# Patient Record
Sex: Male | Born: 1995 | Race: Black or African American | Hispanic: No | Marital: Single | Smoking: Current some day smoker
Health system: Southern US, Community
[De-identification: ages and names within clinical notes are randomized; demographics above are authoritative.]

## PROBLEM LIST (undated history)

## (undated) DIAGNOSIS — G4700 Insomnia, unspecified: Secondary | ICD-10-CM

## (undated) HISTORY — PX: FOOT SURGERY: SHX648

## (undated) HISTORY — DX: Insomnia, unspecified: G47.00

---
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
## 2019-12-07 ENCOUNTER — Other Ambulatory Visit: Payer: Self-pay

## 2019-12-07 DIAGNOSIS — Z20822 Contact with and (suspected) exposure to covid-19: Secondary | ICD-10-CM

## 2019-12-08 LAB — NOVEL CORONAVIRUS, NAA: SARS-CoV-2, NAA: NOT DETECTED

## 2020-09-15 DIAGNOSIS — Z87898 Personal history of other specified conditions: Secondary | ICD-10-CM | POA: Insufficient documentation

## 2020-09-15 DIAGNOSIS — R040 Epistaxis: Secondary | ICD-10-CM | POA: Insufficient documentation

## 2020-09-15 DIAGNOSIS — Z0000 Encounter for general adult medical examination without abnormal findings: Secondary | ICD-10-CM | POA: Insufficient documentation

## 2020-09-16 ENCOUNTER — Other Ambulatory Visit: Payer: Self-pay

## 2020-09-16 ENCOUNTER — Ambulatory Visit: Payer: Self-pay

## 2020-09-16 DIAGNOSIS — Z0289 Encounter for other administrative examinations: Secondary | ICD-10-CM

## 2020-09-16 DIAGNOSIS — Z0000 Encounter for general adult medical examination without abnormal findings: Secondary | ICD-10-CM

## 2020-09-16 LAB — POCT URINALYSIS DIPSTICK
Bilirubin, UA: NEGATIVE
Blood, UA: NEGATIVE
Glucose, UA: NEGATIVE
Ketones, UA: NEGATIVE
Leukocytes, UA: NEGATIVE
Nitrite, UA: NEGATIVE
Protein, UA: NEGATIVE
Spec Grav, UA: 1.015 (ref 1.010–1.025)
Urobilinogen, UA: 0.2 E.U./dL
pH, UA: 6 (ref 5.0–8.0)

## 2020-09-16 NOTE — Progress Notes (Addendum)
Pt presented today to complete partial Pre-employment physical and Complete UDS.  [employer] Specimen ID: [id number]

## 2020-09-19 LAB — CMP12+LP+TP+TSH+6AC+CBC/D/PLT
ALT: 8 IU/L (ref 0–44)
AST: 19 IU/L (ref 0–40)
Albumin/Globulin Ratio: 1.9 (ref 1.2–2.2)
Albumin: 4.9 g/dL (ref 4.1–5.2)
Alkaline Phosphatase: 89 IU/L (ref 44–121)
BUN/Creatinine Ratio: 16 (ref 9–20)
BUN: 18 mg/dL (ref 6–20)
Basophils Absolute: 0 10*3/uL (ref 0.0–0.2)
Basos: 1 %
Bilirubin Total: 1.4 mg/dL — ABNORMAL HIGH (ref 0.0–1.2)
Calcium: 9.7 mg/dL (ref 8.7–10.2)
Chloride: 101 mmol/L (ref 96–106)
Chol/HDL Ratio: 2 ratio (ref 0.0–5.0)
Cholesterol, Total: 175 mg/dL (ref 100–199)
Creatinine, Ser: 1.16 mg/dL (ref 0.76–1.27)
EOS (ABSOLUTE): 0 10*3/uL (ref 0.0–0.4)
Eos: 1 %
Estimated CHD Risk: <0.5 times avg. (ref 0.0–1.0)
Free Thyroxine Index: 1.9 (ref 1.2–4.9)
GFR calc Af Amer: 101 mL/min/{1.73_m2} (ref >59)
GFR calc non Af Amer: 88 mL/min/{1.73_m2} (ref >59)
GGT: 22 IU/L (ref 0–65)
Globulin, Total: 2.6 g/dL (ref 1.5–4.5)
Glucose: 90 mg/dL (ref 65–99)
HDL: 87 mg/dL (ref >39)
Hematocrit: 46.7 % (ref 37.5–51.0)
Hemoglobin: 15.7 g/dL (ref 13.0–17.7)
Immature Grans (Abs): 0 10*3/uL (ref 0.0–0.1)
Immature Granulocytes: 0 %
Iron: 137 ug/dL (ref 38–169)
LDH: 145 IU/L (ref 121–224)
LDL Chol Calc (NIH): 81 mg/dL (ref 0–99)
Lymphocytes Absolute: 2 10*3/uL (ref 0.7–3.1)
Lymphs: 54 %
MCH: 28.3 pg (ref 26.6–33.0)
MCHC: 33.6 g/dL (ref 31.5–35.7)
MCV: 84 fL (ref 79–97)
Monocytes Absolute: 0.2 10*3/uL (ref 0.1–0.9)
Monocytes: 5 %
Neutrophils Absolute: 1.5 10*3/uL (ref 1.4–7.0)
Neutrophils: 39 %
Phosphorus: 3.3 mg/dL (ref 2.8–4.1)
Platelets: 244 10*3/uL (ref 150–450)
Potassium: 4.3 mmol/L (ref 3.5–5.2)
RBC: 5.54 x10E6/uL (ref 4.14–5.80)
RDW: 13.8 % (ref 11.6–15.4)
Sodium: 140 mmol/L (ref 134–144)
T3 Uptake Ratio: 31 % (ref 24–39)
T4, Total: 6 ug/dL (ref 4.5–12.0)
TSH: 0.838 u[IU]/mL (ref 0.450–4.500)
Total Protein: 7.5 g/dL (ref 6.0–8.5)
Triglycerides: 32 mg/dL (ref 0–149)
Uric Acid: 5.5 mg/dL (ref 3.8–8.4)
VLDL Cholesterol Cal: 7 mg/dL (ref 5–40)
WBC: 3.7 10*3/uL (ref 3.4–10.8)

## 2020-09-19 LAB — QUANTIFERON-TB GOLD PLUS
QuantiFERON Mitogen Value: >10 IU/mL
QuantiFERON Nil Value: 0.02 IU/mL
QuantiFERON TB1 Ag Value: 0.03 IU/mL
QuantiFERON TB2 Ag Value: 0.04 IU/mL
QuantiFERON-TB Gold Plus: NEGATIVE

## 2020-09-19 LAB — HEPATITIS B SURFACE ANTIBODY,QUALITATIVE: Hep B Surface Ab, Qual: NONREACTIVE

## 2020-09-22 ENCOUNTER — Ambulatory Visit: Payer: Self-pay | Admitting: Physician Assistant

## 2020-09-22 ENCOUNTER — Encounter: Payer: Self-pay | Admitting: Physician Assistant

## 2020-09-22 ENCOUNTER — Other Ambulatory Visit: Payer: Self-pay

## 2020-09-22 VITALS — BP 132/88 | HR 69 | Temp 97.5°F | Resp 16 | Ht 74.0 in | Wt 178.0 lb

## 2020-09-22 DIAGNOSIS — Z23 Encounter for immunization: Secondary | ICD-10-CM

## 2020-09-22 NOTE — Progress Notes (Signed)
Pt presents today to complete physcial with Ron Katrinka Blazing PA-C, Pt stated he's been having issues sleeping for about a year and a half. CL,RMA

## 2020-09-22 NOTE — Progress Notes (Signed)
° °  Subjective: Preemployment physical    Patient ID: Anthony Moore, male    DOB: 08-19-1995, 25 y.o.   MRN: [id number]  HPI Patient presents for preemployment physical voices no concerns or complaints.   Review of Systems    Negative Objective:   Physical Exam  HEENT is unremarkable.  Neck is supple without adenopathy or bruits.  Lungs clear to auscultation.  Heart is bradycardic regular rhythm.  Abdomen negative HSM, normoactive bowel sounds, soft and nontender to palpation.  No obvious deformity to the upper or lower extremities.  Patient has full and equal range of motion of the upper and lower extremities.  No obvious deformity to the cervical lumbar spine.  Patient has full and equal range of motion of the cervical lumbar spine.  Cranial nerves II through XII grossly intact.     Assessment & Plan: Well exam.  Discussed lab results with patient revealing is nonreactive to hepatitis B.  Patient elected to get the first doses at today's visit.  Patient will follow up as directed for second dose as necessary.

## 2020-10-28 ENCOUNTER — Other Ambulatory Visit: Payer: Self-pay

## 2020-10-28 DIAGNOSIS — Z0184 Encounter for antibody response examination: Secondary | ICD-10-CM

## 2020-10-29 LAB — HEPATITIS B SURFACE ANTIBODY,QUALITATIVE: Hep B Surface Ab, Qual: REACTIVE

## 2020-12-06 ENCOUNTER — Other Ambulatory Visit: Payer: Self-pay

## 2020-12-06 ENCOUNTER — Ambulatory Visit: Payer: Self-pay | Admitting: Physician Assistant

## 2020-12-06 VITALS — BP 120/61 | HR 60 | Temp 98.4°F | Resp 14 | Ht 74.0 in | Wt 182.0 lb

## 2020-12-06 DIAGNOSIS — T783XXA Angioneurotic edema, initial encounter: Secondary | ICD-10-CM

## 2020-12-06 MED ORDER — DENAVIR 1 % EX CREA: 1.0000 "application " | TOPICAL_CREAM | CUTANEOUS | 0 refills | Status: DC

## 2020-12-06 MED ORDER — HYDROXYZINE HCL 50 MG PO TABS: 50.0000 mg | ORAL_TABLET | Freq: Three times a day (TID) | ORAL | 0 refills | Status: DC | PRN

## 2020-12-06 MED ORDER — METHYLPREDNISOLONE 4 MG PO TBPK: ORAL_TABLET | ORAL | 0 refills | Status: DC

## 2020-12-06 NOTE — Progress Notes (Signed)
   Subjective: Lip edema    Patient ID: Anthony Moore, male    DOB: 12-26-1995, 25 y.o.   MRN: [id number]  HPI Patient presents with upper lip edema and erythema. Onset of complaint this morning. Patient state he was brushing his teeth and noticed a blister on the lower lip. Patient state when the tooth brush/paste touch the blister he felt a "burning sensation" followed by upper lip edema. Patient denies dyspnea or dysphagia.   Review of Systems    Negative except for complaint Objective:   Physical Exam No acute distress.  HEENT reveals a "ruptured blister" right lateral lower lip.  Patient up with lip is edematous and erythematous.       Assessment & Plan: Angioedema  Discussed treatment plan consisting of Atarax, Medrol Dosepak, and interview.  Advised to follow-up in 3 days if no improvement or worsening of complaint.

## 2020-12-06 NOTE — Progress Notes (Signed)
Pt presents with lips swollen since 6:30am. Today.  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 CL,RMA

## 2020-12-16 ENCOUNTER — Encounter: Payer: Self-pay | Admitting: Physician Assistant

## 2020-12-16 ENCOUNTER — Ambulatory Visit: Payer: Self-pay | Admitting: Physician Assistant

## 2020-12-16 ENCOUNTER — Other Ambulatory Visit: Payer: Self-pay

## 2020-12-16 ENCOUNTER — Ambulatory Visit
Admission: RE | Admit: 2020-12-16 | Discharge: 2020-12-16 | Disposition: A | Discharge status: Home / Self Care | Payer: Self-pay | Source: Ambulatory Visit | Attending: Physician Assistant | Admitting: Physician Assistant

## 2020-12-16 VITALS — BP 127/78 | HR 51 | Temp 98.0°F | Resp 14 | Ht 74.0 in | Wt 184.0 lb

## 2020-12-16 DIAGNOSIS — M25511 Pain in right shoulder: Secondary | ICD-10-CM

## 2020-12-16 MED ORDER — IBUPROFEN 800 MG PO TABS: 800.0000 mg | ORAL_TABLET | Freq: Three times a day (TID) | ORAL | 0 refills | Status: DC | PRN

## 2020-12-16 MED ORDER — CYCLOBENZAPRINE HCL 10 MG PO TABS: 10.0000 mg | ORAL_TABLET | Freq: Three times a day (TID) | ORAL | 0 refills | Status: DC | PRN
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
## 2020-12-19 ENCOUNTER — Ambulatory Visit: Payer: Self-pay | Admitting: Physician Assistant

## 2020-12-19 ENCOUNTER — Other Ambulatory Visit: Payer: Self-pay

## 2020-12-19 VITALS — BP 123/81 | HR 74 | Temp 98.4°F | Resp 14 | Ht 74.0 in | Wt 182.0 lb

## 2020-12-19 DIAGNOSIS — M25511 Pain in right shoulder: Secondary | ICD-10-CM

## 2020-12-19 NOTE — Progress Notes (Signed)
Pt presents today to follow up on left shoulder pain. Pt states its gotten better but still gets tight as he lift his arm up. Pain level 4. CL,RMA
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
## 2021-01-26 ENCOUNTER — Ambulatory Visit: Payer: Self-pay | Admitting: Adult Medicine

## 2021-01-26 ENCOUNTER — Encounter: Payer: Self-pay | Admitting: Adult Medicine

## 2021-01-26 ENCOUNTER — Other Ambulatory Visit: Payer: Self-pay

## 2021-01-26 VITALS — BP 127/85 | HR 82 | Temp 97.4°F | Resp 14 | Ht 74.0 in | Wt 181.0 lb

## 2021-01-26 DIAGNOSIS — S46912D Strain of unspecified muscle, fascia and tendon at shoulder and upper arm level, left arm, subsequent encounter: Secondary | ICD-10-CM
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
## 2021-03-20 ENCOUNTER — Ambulatory Visit: Payer: Self-pay | Admitting: Physician Assistant

## 2021-03-20 ENCOUNTER — Encounter: Payer: Self-pay | Admitting: Physician Assistant

## 2021-03-20 ENCOUNTER — Other Ambulatory Visit: Payer: Self-pay

## 2021-03-20 VITALS — BP 132/86 | HR 64 | Temp 97.6°F | Resp 14

## 2021-03-20 DIAGNOSIS — W57XXXA Bitten or stung by nonvenomous insect and other nonvenomous arthropods, initial encounter: Secondary | ICD-10-CM

## 2021-03-20 DIAGNOSIS — S50861A Insect bite (nonvenomous) of right forearm, initial encounter: Secondary | ICD-10-CM

## 2021-03-20 NOTE — Progress Notes (Signed)
   Subjective: Insect bite    Patient ID: Anthony Moore, male    DOB: February 06, 1995, 26 y.o.   MRN: [id number]  HPI Patient presents with unidentified insect bite to the right forearm.  Patient patient presents with swollen and mild redness.  Denies any anaphylactic signs or symptoms.  No history of allergy to insect bites or stings.  Review of Systems    Negative septal complaint. Objective:   Physical Exam Papular lesion on erythematous base right forearm.       Assessment & Plan: Localized reaction to insect bite.  Patient given discharge care instruction advised to use Benadryl hydrocortisone cream as directed.  Return back to the clinic condition worsens.

## 2021-03-20 NOTE — Progress Notes (Signed)
R forarm just below antecubital space. Raised, reddened area. Pinpoint area of broken skin from bugbite of unknown origin. Free from appearance of stinger in wound bed. Periwound raised, red, non-tender. Ice applied. Area cleansed with 1/2 strength peroxide, hydrocortisone cream applied. PA assessed prior to leaving clinic.

## 2021-07-04 ENCOUNTER — Other Ambulatory Visit: Payer: Self-pay

## 2021-07-04 ENCOUNTER — Ambulatory Visit: Payer: Self-pay

## 2021-07-04 DIAGNOSIS — Z0000 Encounter for general adult medical examination without abnormal findings: Secondary | ICD-10-CM

## 2021-07-04 LAB — POCT URINALYSIS DIPSTICK
Bilirubin, UA: NEGATIVE
Blood, UA: NEGATIVE
Glucose, UA: NEGATIVE
Ketones, UA: NEGATIVE
Leukocytes, UA: NEGATIVE
Nitrite, UA: NEGATIVE
Protein, UA: NEGATIVE
Spec Grav, UA: >=1.03 — AB (ref 1.010–1.025)
Urobilinogen, UA: 0.2 E.U./dL
pH, UA: 6 (ref 5.0–8.0)

## 2021-07-04 NOTE — Progress Notes (Signed)
Pt scheduled to complete physical 07/10/21 with Ron [employer]

## 2021-07-05 LAB — CMP12+LP+TP+TSH+6AC+CBC/D/PLT
ALT: 10 IU/L (ref 0–44)
AST: 20 IU/L (ref 0–40)
Albumin/Globulin Ratio: 2.6 — ABNORMAL HIGH (ref 1.2–2.2)
Albumin: 4.9 g/dL (ref 4.1–5.2)
Alkaline Phosphatase: 89 IU/L (ref 44–121)
BUN/Creatinine Ratio: 18 (ref 9–20)
BUN: 18 mg/dL (ref 6–20)
Basophils Absolute: 0 10*3/uL (ref 0.0–0.2)
Basos: 1 %
Bilirubin Total: 1.4 mg/dL — ABNORMAL HIGH (ref 0.0–1.2)
Calcium: 9.5 mg/dL (ref 8.7–10.2)
Chloride: 102 mmol/L (ref 96–106)
Chol/HDL Ratio: 2 ratio (ref 0.0–5.0)
Cholesterol, Total: 153 mg/dL (ref 100–199)
Creatinine, Ser: 1 mg/dL (ref 0.76–1.27)
EOS (ABSOLUTE): 0 10*3/uL (ref 0.0–0.4)
Eos: 1 %
Estimated CHD Risk: <0.5 times avg. (ref 0.0–1.0)
Free Thyroxine Index: 1.3 (ref 1.2–4.9)
GGT: 20 IU/L (ref 0–65)
Globulin, Total: 1.9 g/dL (ref 1.5–4.5)
Glucose: 76 mg/dL (ref 65–99)
HDL: 76 mg/dL (ref >39)
Hematocrit: 45.1 % (ref 37.5–51.0)
Hemoglobin: 14.9 g/dL (ref 13.0–17.7)
Immature Grans (Abs): 0 10*3/uL (ref 0.0–0.1)
Immature Granulocytes: 0 %
Iron: 120 ug/dL (ref 38–169)
LDH: 147 IU/L (ref 121–224)
LDL Chol Calc (NIH): 65 mg/dL (ref 0–99)
Lymphocytes Absolute: 1.9 10*3/uL (ref 0.7–3.1)
Lymphs: 53 %
MCH: 28 pg (ref 26.6–33.0)
MCHC: 33 g/dL (ref 31.5–35.7)
MCV: 85 fL (ref 79–97)
Monocytes Absolute: 0.2 10*3/uL (ref 0.1–0.9)
Monocytes: 6 %
Neutrophils Absolute: 1.4 10*3/uL (ref 1.4–7.0)
Neutrophils: 39 %
Phosphorus: 2.8 mg/dL (ref 2.8–4.1)
Platelets: 231 10*3/uL (ref 150–450)
Potassium: 4.1 mmol/L (ref 3.5–5.2)
RBC: 5.33 x10E6/uL (ref 4.14–5.80)
RDW: 13.9 % (ref 11.6–15.4)
Sodium: 142 mmol/L (ref 134–144)
T3 Uptake Ratio: 26 % (ref 24–39)
T4, Total: 5 ug/dL (ref 4.5–12.0)
TSH: 0.964 u[IU]/mL (ref 0.450–4.500)
Total Protein: 6.8 g/dL (ref 6.0–8.5)
Triglycerides: 56 mg/dL (ref 0–149)
Uric Acid: 5.5 mg/dL (ref 3.8–8.4)
VLDL Cholesterol Cal: 12 mg/dL (ref 5–40)
WBC: 3.5 10*3/uL (ref 3.4–10.8)
eGFR: 107 mL/min/{1.73_m2} (ref >59)

## 2021-07-10 ENCOUNTER — Ambulatory Visit: Payer: Self-pay | Admitting: Physician Assistant

## 2021-07-10 ENCOUNTER — Other Ambulatory Visit: Payer: Self-pay

## 2021-07-10 ENCOUNTER — Encounter: Payer: Self-pay | Admitting: Physician Assistant

## 2021-07-10 VITALS — BP 118/75 | HR 60 | Temp 97.8°F | Resp 12 | Ht 74.0 in | Wt 187.0 lb

## 2021-07-10 DIAGNOSIS — Z0000 Encounter for general adult medical examination without abnormal findings: Secondary | ICD-10-CM

## 2021-07-10 DIAGNOSIS — Z23 Encounter for immunization: Secondary | ICD-10-CM

## 2021-07-10 NOTE — Progress Notes (Signed)
   Subjective: Annual firefighter physical exam    Patient ID: Anthony Moore, male    DOB: 05-03-1995, 26 y.o.   MRN: [id number]  HPI Patient presented annual physical exam.  Possible concerns or complaints.   Review of Systems Negative    Objective:   Physical Exam No acute distress.  Temperature 97.8, pulse 60, respiration 12, BP 118/75, patient 90% O2 sat on room air.  Patient weighs 187 pounds.  BMI is 24. HEENT is unremarkable.  Neck is supple without adenopathy or bruits.  Lungs are clear to auscultation.  Heart regular rate and rhythm. Abdomen with negative HSM, normoactive bowel sounds, soft, nontender to palpation. No obvious deformity to the upper or lower extremities.  Patient has full and equal range of motion of the upper and lower extremities. No obvious cervical or lumbar spine deformity.  Patient has full and equal range of motion of the cervical lumbar spine. Cranial nerves II through XII grossly intact.  DTRs are 2+ without clonus.     Assessment & Plan: Well exam.   Discussed no acute findings on lab results with patient patient advised follow-up as necessary.

## 2021-07-10 NOTE — Progress Notes (Signed)
Covid Vaccine - Has had 2 doses of Pfizer, but no booster.  Will get the dates to [place] so we can update Epic.  Hep C & HIV - Not interested in it at this time.  Tdap - Last was 09/18/2007 according to paper chart in the clinic.  Will update it today while at the clinic. Had placed order for Tdap, but patient left clinic without receiving vaccine.  HPV - Thinking about - will discuss with provider.  Advised he can obtain for [facility]  AMD

## 2021-08-17 ENCOUNTER — Ambulatory Visit: Payer: Self-pay

## 2021-08-17 ENCOUNTER — Other Ambulatory Visit: Payer: Self-pay

## 2021-08-17 VITALS — BP 130/80

## 2021-08-17 DIAGNOSIS — Z0130 Encounter for examination of blood pressure without abnormal findings: Secondary | ICD-10-CM

## 2021-08-17 NOTE — Progress Notes (Signed)
Pt presents today for BP check. CL,RMA 
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
## 2022-04-03 ENCOUNTER — Ambulatory Visit: Payer: 59 | Admitting: Physician Assistant

## 2022-06-14 DIAGNOSIS — Z0000 Encounter for general adult medical examination without abnormal findings: Secondary | ICD-10-CM

## 2022-06-15 ENCOUNTER — Ambulatory Visit: Payer: Self-pay

## 2022-06-15 DIAGNOSIS — Z021 Encounter for pre-employment examination: Secondary | ICD-10-CM

## 2022-06-15 DIAGNOSIS — Z0289 Encounter for other administrative examinations: Secondary | ICD-10-CM

## 2022-06-15 LAB — POCT URINALYSIS DIPSTICK
Bilirubin, UA: NEGATIVE
Blood, UA: NEGATIVE
Glucose, UA: NEGATIVE
Ketones, UA: NEGATIVE
Leukocytes, UA: NEGATIVE
Nitrite, UA: NEGATIVE
Protein, UA: NEGATIVE
Spec Grav, UA: 1.02 (ref 1.010–1.025)
Urobilinogen, UA: 0.2 E.U./dL
pH, UA: 7 (ref 5.0–8.0)

## 2022-06-15 NOTE — Progress Notes (Signed)
Pt presents today for physical labs, will return to clinic for scheduled physical.  

## 2022-06-16 LAB — CMP12+LP+TP+TSH+6AC+CBC/D/PLT
ALT: 8 IU/L (ref 0–44)
AST: 20 IU/L (ref 0–40)
Albumin/Globulin Ratio: 2.2 (ref 1.2–2.2)
Albumin: 4.7 g/dL (ref 4.1–5.2)
Alkaline Phosphatase: 81 IU/L (ref 44–121)
BUN/Creatinine Ratio: 12 (ref 9–20)
BUN: 14 mg/dL (ref 6–20)
Basophils Absolute: 0 10*3/uL (ref 0.0–0.2)
Basos: 1 %
Bilirubin Total: 2 mg/dL — ABNORMAL HIGH (ref 0.0–1.2)
Calcium: 9.5 mg/dL (ref 8.7–10.2)
Chloride: 101 mmol/L (ref 96–106)
Chol/HDL Ratio: 2 ratio (ref 0.0–5.0)
Cholesterol, Total: 181 mg/dL (ref 100–199)
Creatinine, Ser: 1.14 mg/dL (ref 0.76–1.27)
EOS (ABSOLUTE): 0 10*3/uL (ref 0.0–0.4)
Eos: 1 %
Estimated CHD Risk: <0.5 times avg. (ref 0.0–1.0)
Free Thyroxine Index: 1.4 (ref 1.2–4.9)
GGT: 26 IU/L (ref 0–65)
Globulin, Total: 2.1 g/dL (ref 1.5–4.5)
Glucose: 87 mg/dL (ref 70–99)
HDL: 90 mg/dL (ref >39)
Hematocrit: 45.1 % (ref 37.5–51.0)
Hemoglobin: 15.4 g/dL (ref 13.0–17.7)
Immature Grans (Abs): 0 10*3/uL (ref 0.0–0.1)
Immature Granulocytes: 0 %
Iron: 255 ug/dL — ABNORMAL HIGH (ref 38–169)
LDH: 137 IU/L (ref 121–224)
LDL Chol Calc (NIH): 82 mg/dL (ref 0–99)
Lymphocytes Absolute: 1.4 10*3/uL (ref 0.7–3.1)
Lymphs: 52 %
MCH: 29.1 pg (ref 26.6–33.0)
MCHC: 34.1 g/dL (ref 31.5–35.7)
MCV: 85 fL (ref 79–97)
Monocytes Absolute: 0.2 10*3/uL (ref 0.1–0.9)
Monocytes: 8 %
Neutrophils Absolute: 1 10*3/uL — ABNORMAL LOW (ref 1.4–7.0)
Neutrophils: 38 %
Phosphorus: 2.8 mg/dL (ref 2.8–4.1)
Platelets: 243 10*3/uL (ref 150–450)
Potassium: 4.3 mmol/L (ref 3.5–5.2)
RBC: 5.3 x10E6/uL (ref 4.14–5.80)
RDW: 14.2 % (ref 11.6–15.4)
Sodium: 138 mmol/L (ref 134–144)
T3 Uptake Ratio: 27 % (ref 24–39)
T4, Total: 5.3 ug/dL (ref 4.5–12.0)
TSH: 0.321 u[IU]/mL — ABNORMAL LOW (ref 0.450–4.500)
Total Protein: 6.8 g/dL (ref 6.0–8.5)
Triglycerides: 46 mg/dL (ref 0–149)
Uric Acid: 5.3 mg/dL (ref 3.8–8.4)
VLDL Cholesterol Cal: 9 mg/dL (ref 5–40)
WBC: 2.6 10*3/uL — ABNORMAL LOW (ref 3.4–10.8)
eGFR: 91 mL/min/{1.73_m2} (ref >59)

## 2022-06-18 ENCOUNTER — Ambulatory Visit: Payer: Self-pay | Admitting: Physician Assistant

## 2022-06-18 ENCOUNTER — Encounter: Payer: Self-pay | Admitting: Physician Assistant

## 2022-06-18 VITALS — BP 120/68 | HR 70 | Temp 97.7°F | Resp 12 | Ht 74.0 in | Wt 184.0 lb

## 2022-06-18 DIAGNOSIS — F5102 Adjustment insomnia: Secondary | ICD-10-CM

## 2022-06-18 DIAGNOSIS — Z0289 Encounter for other administrative examinations: Secondary | ICD-10-CM

## 2022-06-18 MED ORDER — ZOLPIDEM TARTRATE 10 MG PO TABS: 10.0000 mg | ORAL_TABLET | Freq: Every evening | ORAL | 1 refills | Status: DC | PRN

## 2022-06-18 NOTE — Progress Notes (Signed)
[facility] occupational health clinic  ____________________________________________   None    (approximate)  I have reviewed the triage vital signs and the nursing notes.   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  Patient states he has trouble falling asleep when he comes off his 24-hour shifts.         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 ENT: No sore throat. Cardiovascular: Denies chest pain. Respiratory: Denies shortness of breath. Gastrointestinal: No abdominal pain.  No nausea, no vomiting.  No diarrhea.  No constipation. Genitourinary: Negative for dysuria. Musculoskeletal: Negative for back pain. Skin: Negative for rash. Neurological: Negative for headaches, focal weakness or numbness. Allergic/Immunilogical:  Penicillin  ____________________________________________   PHYSICAL EXAM:  VITAL SIGNS: Constitutional: Alert and oriented. Well appearing and in no acute distress. Eyes: Conjunctivae are normal. PERRL. EOMI. Head: Atraumatic. Nose: No congestion/rhinnorhea. Mouth/Throat: Mucous membranes are moist.  Oropharynx non-erythematous. Neck: No stridor.  No cervical spine tenderness to palpation. Hematological/Lymphatic/Immunilogical: No cervical lymphadenopathy. Cardiovascular: Normal rate, regular rhythm. Grossly normal heart sounds.  Good peripheral circulation. Respiratory: Normal respiratory effort.  No retractions. Lungs CTAB. Gastrointestinal: Soft and nontender. No distention. No abdominal bruits. No CVA tenderness. Genitourinary: Deferred Musculoskeletal: No lower extremity tenderness nor edema.  No joint effusions. Neurologic:  Normal speech and language. No gross focal neurologic deficits are appreciated. No gait instability. Skin:  Skin is warm, dry and intact. No rash noted. Psychiatric: Mood and affect are normal. Speech and behavior are normal.  ____________________________________________   LABS  _       Component Ref Range & Units 3 d ago 11 mo ago 1 yr ago  Color, UA  yellow  medium yellow  yellow   Clarity, UA  clear  clear  clear   Glucose, UA Negative Negative  Negative  Negative   Bilirubin, UA  neg  negative  negative   Ketones, UA  neg  negative  negative   Spec Grav, UA 1.010 - 1.025 1.020  >=1.030 Abnormal   1.015   Blood, UA  neg  negative  negative   pH, UA 5.0 - 8.0 7.0  6.0  6.0   Protein, UA Negative Negative  Negative  Negative   Urobilinogen,  UA 0.2 or 1.0 E.U./dL 0.2  0.2  0.2   Nitrite, UA  neg  negative  negative   Leukocytes, UA Negative Negative  Negative  Negative   Appearance   medium  light   Odor                        Other Results from 06/15/2022   Contains abnormal data CMP12+LP+TP+TSH+6AC+CBC/D/Plt Order: [id number]         Component Ref Range & Units 3 d ago 11 mo ago 1 yr ago  Glucose 70 - 99 mg/dL 87  76 R  90 R   Uric Acid 3.8 - 8.4 mg/dL 5.3  5.5 CM  5.5 CM   Comment:            Therapeutic target for gout patients: <6.0  BUN 6 - 20 mg/dL 14  18  18    Creatinine, Ser 0.76 - 1.27 mg/dL 1.14  1.00  1.16   eGFR >59 mL/min/1.73 91  107    BUN/Creatinine Ratio 9 - 20 12  18  16    Sodium 134 - 144 mmol/L 138  142  140   Potassium 3.5 - 5.2 mmol/L 4.3  4.1  4.3   Chloride 96 - 106 mmol/L 101  102  101   Calcium 8.7 - 10.2 mg/dL 9.5  9.5  9.7   Phosphorus 2.8 - 4.1 mg/dL 2.8  2.8  3.3   Total Protein 6.0 - 8.5 g/dL 6.8  6.8  7.5   Albumin 4.1 - 5.2 g/dL 4.7  4.9  4.9   Globulin, Total 1.5 - 4.5 g/dL 2.1  1.9  2.6   Albumin/Globulin Ratio 1.2 - 2.2 2.2  2.6 High   1.9   Bilirubin Total 0.0 - 1.2 mg/dL 2.0 High   1.4 High   1.4 High    Alkaline Phosphatase 44 - 121 IU/L 81  89  89 CM   LDH 121 - 224 IU/L 137  147  145   AST 0 - 40 IU/L 20  20  19    ALT 0 - 44 IU/L 8  10  8    GGT 0 - 65 IU/L 26  20  22    Iron 38 - 169 ug/dL 255 High   120  137   Cholesterol, Total 100 - 199 mg/dL 181  153  175   Triglycerides 0 - 149 mg/dL 46  56  32   HDL >39 mg/dL 90  76  87   VLDL Cholesterol Cal 5 - 40 mg/dL 9  12  7    LDL Chol Calc (NIH) 0 - 99 mg/dL 82  65  81   Chol/HDL Ratio 0.0 - 5.0 ratio 2.0  2.0 CM  2.0 CM     Estimated CHD Risk 0.0 - 1.0 times avg.  < 0.5   < 0.5 CM   < 0.5 CM   Comment: The CHD Risk is based on the T. Chol/HDL ratio. Other  factors affect CHD Risk such as hypertension, smoking,  diabetes, severe obesity, and family history of  premature CHD.   TSH 0.450 - 4.500 uIU/mL 0.321 Low   0.964  0.838   T4, Total 4.5 - 12.0 ug/dL 5.3  5.0  6.0   T3 Uptake Ratio 24 - 39 % 27  26  31    Free Thyroxine Index 1.2 - 4.9 1.4  1.3  1.9   WBC 3.4 - 10.8 x10E3/uL 2.6 Low  3.5  3.7   RBC 4.14 - 5.80 x10E6/uL 5.30  5.33  5.54   Hemoglobin 13.0 - 17.7 g/dL 15.4  14.9  15.7   Hematocrit 37.5 - 51.0 % 45.1   45.1  46.7   MCV 79 - 97 fL 85  85  84   MCH 26.6 - 33.0 pg 29.1  28.0  28.3   MCHC 31.5 - 35.7 g/dL 34.1  33.0  33.6   RDW 11.6 - 15.4 % 14.2  13.9  13.8   Platelets 150 - 450 x10E3/uL 243  231  244   Neutrophils Not Estab. % 38  39  39   Lymphs Not Estab. % 52  53  54   Monocytes Not Estab. % 8  6  5    Eos Not Estab. % 1  1  1    Basos Not Estab. % 1  1  1    Neutrophils Absolute 1.4 - 7.0 x10E3/uL 1.0 Low   1.4  1.5   Lymphocytes Absolute 0.7 - 3.1 x10E3/uL 1.4  1.9  2.0   Monocytes Absolute 0.1 - 0.9 x10E3/uL 0.2  0.2  0.2   EOS (ABSOLUTE) 0.0 - 0.4 x10E3/uL 0.0  0.0  0.0   Basophils Absolute 0.0 - 0.2 x10E3/uL 0.0  0.0  0.0   Immature Granulocytes Not Estab. % 0  0  0   Immature Grans         Sinus  Bradycardia  -Short PR syndrome  PRi = 98 -RSR(V1) -nondiagnostic.   -Anterolateral ST-elevation -repolarization variant.   BORDERLINE RHYTHM___________________________________________    ____________________________________________   INITIAL IMPRESSION / ASSESSMENT AND PLAN  As part of my medical decision making, I reviewed the following data within the [facility] [facility]      Discussed lab results with patient.  Discussed decreased use of supplements for weight training.  Advised using Ambien when he finishes 24-hour shifts.  Advised not to use this medication while working.        ____________________________________________   FINAL CLINICAL IMPRESSION(S) / ED DIAGNOSES  Well exam and insomnia.   ED Discharge Orders     None        Note:  This document was prepared using Dragon voice recognition software and may include unintentional dictation errors.

## 2023-03-15 ENCOUNTER — Ambulatory Visit (INDEPENDENT_AMBULATORY_CARE_PROVIDER_SITE_OTHER): Payer: 59 | Admitting: Internal Medicine

## 2023-03-15 ENCOUNTER — Encounter: Payer: Self-pay | Admitting: Internal Medicine

## 2023-03-15 VITALS — BP 122/74 | HR 84 | Temp 98.0°F | Resp 16 | Ht 74.0 in | Wt 179.9 lb

## 2023-03-15 DIAGNOSIS — M5431 Sciatica, right side: Secondary | ICD-10-CM

## 2023-03-15 DIAGNOSIS — R6882 Decreased libido: Secondary | ICD-10-CM

## 2023-03-15 DIAGNOSIS — F5102 Adjustment insomnia: Secondary | ICD-10-CM

## 2023-03-15 DIAGNOSIS — D709 Neutropenia, unspecified: Secondary | ICD-10-CM

## 2023-03-15 DIAGNOSIS — R7989 Other specified abnormal findings of blood chemistry: Secondary | ICD-10-CM

## 2023-03-15 MED ORDER — NAPROXEN 500 MG PO TABS: 500.0000 mg | ORAL_TABLET | Freq: Two times a day (BID) | ORAL | 0 refills | Status: AC

## 2023-03-15 MED ORDER — TIZANIDINE HCL 4 MG PO TABS: 4.0000 mg | ORAL_TABLET | Freq: Four times a day (QID) | ORAL | 0 refills | Status: DC | PRN

## 2023-03-15 NOTE — Progress Notes (Signed)
New Patient Office Visit  Subjective    Patient ID: Anthony Moore, male    DOB: Aug 03, 1995  Age: 27 y.o. MRN: [id number]  CC:  Chief Complaint  Patient presents with   Sciatica    Right low back radiates down leg onset 6 months    HPI Anthony Moore presents to establish care. He has no chronic medical conditions and does not take any daily medications.   Insomnia: -Was prescribed Ambien 10 mg in the past but hasn't taken since December, had some sleep walking episodes while on it -Works long shifts, only sleeps about 4 hours at a time  BACK PAIN Duration: months Mechanism of injury: no trauma - has a history of equinus deformity of the left foot in 2014 and underwent multiple orthopedic procedures at the time include left flatfoot reconstruction with evans osteotomy, cotton osteotomy and peroneus brevis intramuscular lengthening - done on 04/14/23 Location: Right, low back, and upper back Onset: gradual Quality: aching, burning, and shooting Frequency: intermittent Radiation: R leg above the knee Aggravating factors: lifting, movement, walking, and laying Alleviating factors: rest and NSAIDs Status: fluctuating Relief with NSAIDs?: moderate  Health Maintenance: -Blood work due -Tdap due   Outpatient Encounter Medications as of 03/15/2023  Medication Sig   zolpidem (AMBIEN) 10 MG tablet Take 1 tablet (10 mg total) by mouth at bedtime as needed for sleep.   No facility-administered encounter medications on file as of 03/15/2023.    Past Medical History:  Diagnosis Date   Insomnia    Plantar fasciitis 2014    Past Surgical History:  Procedure Laterality Date   FOOT SURGERY     PLANTAR FASCIA SURGERY Left 2015    Family History  Problem Relation Age of Onset   Lupus Mother     Social History   Socioeconomic History   Marital status: Single    Spouse name: Not on file   Number of children: Not on file   Years of education: Not on file   Highest  education level: Not on file  Occupational History   Not on file  Tobacco Use   Smoking status: Some Days    Types: E-cigarettes   Smokeless tobacco: Former    Types: Snuff  Vaping Use   Vaping Use: Every day  Substance and Sexual Activity   Alcohol use: Yes   Drug use: Never   Sexual activity: Yes  Other Topics Concern   Not on file  Social History Narrative   Not on file   Social Determinants of Health   Financial Resource Strain: Not on file  Food Insecurity: Not on file  Transportation Needs: Not on file  Physical Activity: Not on file  Stress: Not on file  Social Connections: Not on file  Intimate Partner Violence: Not on file    Review of Systems  Constitutional:  Negative for chills and fever.  Respiratory:  Negative for shortness of breath.   Cardiovascular:  Negative for chest pain.  Musculoskeletal:  Positive for back pain.        Objective    BP 122/74   Pulse 84   Temp 98 F (36.7 C)   Resp 16   Ht 6\' 2"  (1.88 m)   Wt 179 lb 14.4 oz (81.6 kg)   SpO2 99%   BMI 23.10 kg/m   Physical Exam Constitutional:      Appearance: Normal appearance.  HENT:     Head: Normocephalic and atraumatic.  Mouth/Throat:     Mouth: Mucous membranes are moist.     Pharynx: Oropharynx is clear.  Eyes:     Conjunctiva/sclera: Conjunctivae normal.  Cardiovascular:     Rate and Rhythm: Normal rate and regular rhythm.  Pulmonary:     Effort: Pulmonary effort is normal.     Breath sounds: Normal breath sounds.  Musculoskeletal:     Right lower leg: No edema.     Left lower leg: No edema.     Comments: Postural changes on right - right shoulder inferior compared to left. Right sided lumbar paraspinal hypertonicity present   Skin:    General: Skin is warm and dry.  Neurological:     General: No focal deficit present.     Mental Status: He is alert. Mental status is at baseline.  Psychiatric:        Mood and Affect: Mood normal.        Behavior: Behavior  normal.         Assessment & Plan:   1. Sciatica of right side: Treat sciatica with scheduled anti-inflammatories for 10 days and muscle relaxer as needed. Stretches provided however I am concerned this is more postural due to his history of orthopedic procedures on his left foot. Consider referral to PT and/or sports medicine.  - naproxen (NAPROSYN) 500 MG tablet; Take 1 tablet (500 mg total) by mouth 2 (two) times daily with a meal for 10 days.  Dispense: 20 tablet; Refill: 0 - tiZANidine (ZANAFLEX) 4 MG tablet; Take 1 tablet (4 mg total) by mouth every 6 (six) hours as needed for muscle spasms.  Dispense: 30 tablet; Refill: 0  2. Decreased thyroid stimulating hormone (TSH) level: On labs from 6/23, recheck. - Thyroid Panel With TSH  3. Increased storage iron: Check CMP, CBC.   - CBC w/Diff/Platelet - COMPLETE METABOLIC PANEL WITH GFR  4. Low libido: Check testosterone levels.   - Testosterone  5. Adjustment insomnia: Had been taking Ambien PRN but having side effects, will discontinue.    Return in about 1 year (around 03/14/2024).   Teodora Medici, DO

## 2023-03-15 NOTE — Patient Instructions (Addendum)
It was great seeing you today!  Plan discussed at today's visit: -Blood work ordered today, results will be uploaded to [facility].  -Consider Tdap vaccine -Take Naproxen 500 mg twice a day for 10 days, can take muscle relaxer as needed at night, try rehab exercises below. Let me know if symptoms worsen or fail to improve and we can you in with either physical therapy or sports medicine   Follow up in: 1 year or sooner as needed  Take care and let us know if you have any questions or concerns prior to your next visit.  Dr. Rosana Berger  Sciatica Rehab Ask your health care provider which exercises are safe for you. Do exercises exactly as told by your health care provider and adjust them as directed. It is normal to feel mild stretching, pulling, tightness, or discomfort as you do these exercises. Stop right away if you feel sudden pain or your pain gets worse. Do not begin these exercises until told by your health care provider. Stretching and range-of-motion exercises These exercises warm up your muscles and joints and improve the movement and flexibility of your hips and back. These exercises also help to relieve pain, numbness, and tingling. Sciatic nerve glide  Sit in a chair with your head facing down toward your chest. Place your hands behind your back. Let your shoulders slump forward. Slowly straighten one of your legs while you tilt your head back as if you are looking toward the ceiling. Only straighten your leg as far as you can without making your symptoms worse. Hold this position for __________ seconds. Slowly return your leg and head back to the starting position. Repeat with your other leg. Repeat __________ times. Complete this exercise __________ times a day. Knee to chest with hip adduction and internal rotation  Lie on your back on a firm surface with both legs straight. Bend one of your knees and move it up toward your chest until you feel a gentle stretch in your lower back  and buttock. Then, move your knee toward the shoulder that is on the opposite side from your leg. This is hip adduction and internal rotation. Hold your leg in this position by holding on to the front of your knee. Hold this position for __________ seconds. Slowly return to the starting position. Repeat with your other leg. Repeat __________ times. Complete this exercise __________ times a day. Prone extension on elbows  Lie on your abdomen on a firm surface. A bed may be too soft for this exercise. Prop yourself up on your elbows. Use your arms to help lift your chest up until you feel a gentle stretch in your abdomen and your lower back. This will place some of your body weight on your elbows. If this is uncomfortable, try stacking pillows under your chest. Your hips should stay down, against the surface that you are lying on. Keep your hip and back muscles relaxed. Hold this position for __________ seconds. Slowly relax your upper body and return to the starting position. Repeat __________ times. Complete this exercise __________ times a day. Strengthening exercises These exercises build strength and endurance in your back. Endurance is the ability to use your muscles for a long time, even after they get tired. Pelvic tilt This exercise strengthens the muscles that lie deep in the abdomen. Lie on your back on a firm surface. Bend your knees and keep your feet flat on the surface. Tense your abdominal muscles. Tip your pelvis up toward the ceiling  and flatten your lower back into the firm surface. To help with this exercise, you may place a small towel under your lower back and try to push your back into the towel. Hold this position for __________ seconds. Let your muscles relax completely before you repeat this exercise. Repeat __________ times. Complete this exercise __________ times a day. Alternating arm and leg raises  Get on your hands and knees on a firm surface. If you are on a  hard floor, you may want to use padding, such as an exercise mat, to cushion your knees. Line up your arms and legs. Your hands should be directly below your shoulders, and your knees should be directly below your hips. Lift your left leg behind you. At the same time, raise your right arm and straighten it in front of you. Do not lift your leg higher than your hip. Do not lift your arm higher than your shoulder. Keep your abdominal and back muscles tight. Keep your hips facing the ground. Do not arch your back. Keep your balance carefully, and do not hold your breath. Hold this position for __________ seconds. Slowly return to the starting position. Repeat with your right leg and your left arm. Repeat __________ times. Complete this exercise __________ times a day. Posture and body mechanics Good posture and healthy body mechanics can help to relieve stress in your body's tissues and joints. Body mechanics refers to the movements and positions of your body while you do your daily activities. Posture is part of body mechanics. Good posture means: Your spine is in its natural S-curve position (neutral). Your shoulders are pulled back slightly. Your head is not tipped forward. Follow these guidelines to improve your posture and body mechanics in your everyday activities. Standing  When standing, keep your spine neutral and your feet about hip width apart. Keep a slight bend in your knees. Your ears, shoulders, and hips should line up. When you do a task in which you stand in one place for a long time, place one foot up on a stable object that is 2-4 inches (5-10 cm) high, such as a footstool. This helps keep your spine neutral. Sitting  When sitting, keep your spine neutral and keep your feet flat on the floor. Use a footrest, if necessary, and keep your thighs parallel to the floor. Avoid rounding your shoulders, and avoid tilting your head forward. When working at a desk or a computer, keep  your desk at a height where your hands are slightly lower than your elbows. Slide your chair under your desk so you are close enough to maintain good posture. When working at a computer, place your monitor at a height where you are looking straight ahead and you do not have to tilt your head forward or downward to look at the screen. Resting  When lying down and resting, avoid positions that are most painful for you. If you have pain with activities such as sitting, bending, stooping, or squatting, lie in a position in which your body does not bend very much. For example, avoid curling up on your side with your arms and knees near your chest (fetal position). If you have pain with activities such as standing for a long time or reaching with your arms, lie with your spine in a neutral position and bend your knees slightly. Try the following positions: Lying on your side with a pillow between your knees. Lying on your back with a pillow under your knees. Lifting  When lifting objects, keep your feet at least shoulder width apart and tighten your abdominal muscles. Bend your knees and hips and keep your spine neutral. It is important to lift using the strength of your legs, not your back. Do not lock your knees straight out. Always ask for help to lift heavy or awkward objects. This information is not intended to replace advice given to you by your health care provider. Make sure you discuss any questions you have with your health care provider. Document Revised: 03/27/2022 Document Reviewed: 03/27/2022 Elsevier Patient Education  [place].

## 2023-03-16 LAB — COMPLETE METABOLIC PANEL WITH GFR
AG Ratio: 2 (calc) (ref 1.0–2.5)
ALT: 7 U/L — ABNORMAL LOW (ref 9–46)
AST: 18 U/L (ref 10–40)
Albumin: 4.9 g/dL (ref 3.6–5.1)
Alkaline phosphatase (APISO): 71 U/L (ref 36–130)
BUN: 14 mg/dL (ref 7–25)
CO2: 28 mmol/L (ref 20–32)
Calcium: 10 mg/dL (ref 8.6–10.3)
Chloride: 105 mmol/L (ref 98–110)
Creat: 1.15 mg/dL (ref 0.60–1.24)
Globulin: 2.5 g/dL (calc) (ref 1.9–3.7)
Glucose, Bld: 76 mg/dL (ref 65–99)
Potassium: 4.6 mmol/L (ref 3.5–5.3)
Sodium: 142 mmol/L (ref 135–146)
Total Bilirubin: 1.6 mg/dL — ABNORMAL HIGH (ref 0.2–1.2)
Total Protein: 7.4 g/dL (ref 6.1–8.1)
eGFR: 90 mL/min/{1.73_m2} (ref >=60)

## 2023-03-16 LAB — CBC WITH DIFFERENTIAL/PLATELET
Absolute Monocytes: 223 cells/uL (ref 200–950)
Basophils Absolute: 22 cells/uL (ref 0–200)
Basophils Relative: 0.7 %
Eosinophils Absolute: 9 cells/uL — ABNORMAL LOW (ref 15–500)
Eosinophils Relative: 0.3 %
HCT: 45.8 % (ref 38.5–50.0)
Hemoglobin: 15.1 g/dL (ref 13.2–17.1)
Lymphs Abs: 1655 cells/uL (ref 850–3900)
MCH: 28.1 pg (ref 27.0–33.0)
MCHC: 33 g/dL (ref 32.0–36.0)
MCV: 85.1 fL (ref 80.0–100.0)
MPV: 11.9 fL (ref 7.5–12.5)
Monocytes Relative: 7.2 %
Neutro Abs: 1190 cells/uL — ABNORMAL LOW (ref 1500–7800)
Neutrophils Relative %: 38.4 %
Platelets: 253 10*3/uL (ref 140–400)
RBC: 5.38 10*6/uL (ref 4.20–5.80)
RDW: 14.1 % (ref 11.0–15.0)
Total Lymphocyte: 53.4 %
WBC: 3.1 10*3/uL — ABNORMAL LOW (ref 3.8–10.8)

## 2023-03-16 LAB — THYROID PANEL WITH TSH
Free Thyroxine Index: 2 (ref 1.4–3.8)
T3 Uptake: 36 % — ABNORMAL HIGH (ref 22–35)
T4, Total: 5.6 ug/dL (ref 4.9–10.5)
TSH: 0.91 mIU/L (ref 0.40–4.50)

## 2023-03-16 LAB — TESTOSTERONE: Testosterone: 793 ng/dL (ref 250–827)

## 2023-03-18 NOTE — Addendum Note (Signed)
Addended by: Teodora Medici on: 03/18/2023 01:00 PM   Modules accepted: Orders

## 2023-05-15 ENCOUNTER — Other Ambulatory Visit: Payer: Self-pay

## 2023-05-15 DIAGNOSIS — Z0283 Encounter for blood-alcohol and blood-drug test: Secondary | ICD-10-CM

## 2023-05-28 ENCOUNTER — Ambulatory Visit: Payer: Self-pay

## 2023-05-28 DIAGNOSIS — Z0289 Encounter for other administrative examinations: Secondary | ICD-10-CM

## 2023-05-28 LAB — POCT URINALYSIS DIPSTICK
Bilirubin, UA: NEGATIVE
Blood, UA: NEGATIVE
Glucose, UA: NEGATIVE
Ketones, UA: NEGATIVE
Leukocytes, UA: NEGATIVE
Nitrite, UA: NEGATIVE
Protein, UA: NEGATIVE
Spec Grav, UA: 1.025 (ref 1.010–1.025)
Urobilinogen, UA: 0.2 E.U./dL
pH, UA: 6 (ref 5.0–8.0)

## 2023-05-28 NOTE — Progress Notes (Signed)
Pt completed labs for Fire Physical.

## 2023-05-29 LAB — CMP12+LP+TP+TSH+6AC+CBC/D/PLT
ALT: 9 IU/L (ref 0–44)
AST: 19 IU/L (ref 0–40)
Albumin/Globulin Ratio: 2.2 (ref 1.2–2.2)
Albumin: 4.7 g/dL (ref 4.3–5.2)
Alkaline Phosphatase: 81 IU/L (ref 44–121)
BUN/Creatinine Ratio: 12 (ref 9–20)
BUN: 13 mg/dL (ref 6–20)
Basophils Absolute: 0 10*3/uL (ref 0.0–0.2)
Basos: 0 %
Bilirubin Total: 1.4 mg/dL — ABNORMAL HIGH (ref 0.0–1.2)
Calcium: 9.4 mg/dL (ref 8.7–10.2)
Chloride: 102 mmol/L (ref 96–106)
Chol/HDL Ratio: 1.9 ratio (ref 0.0–5.0)
Cholesterol, Total: 181 mg/dL (ref 100–199)
Creatinine, Ser: 1.11 mg/dL (ref 0.76–1.27)
EOS (ABSOLUTE): 0 10*3/uL (ref 0.0–0.4)
Eos: 1 %
Estimated CHD Risk: <0.5 times avg. (ref 0.0–1.0)
Free Thyroxine Index: 1.4 (ref 1.2–4.9)
GGT: 23 IU/L (ref 0–65)
Globulin, Total: 2.1 g/dL (ref 1.5–4.5)
Glucose: 78 mg/dL (ref 70–99)
HDL: 97 mg/dL (ref >39)
Hematocrit: 44 % (ref 37.5–51.0)
Hemoglobin: 14.8 g/dL (ref 13.0–17.7)
Immature Grans (Abs): 0 10*3/uL (ref 0.0–0.1)
Immature Granulocytes: 0 %
Iron: 82 ug/dL (ref 38–169)
LDH: 142 IU/L (ref 121–224)
LDL Chol Calc (NIH): 73 mg/dL (ref 0–99)
Lymphocytes Absolute: 1.6 10*3/uL (ref 0.7–3.1)
Lymphs: 48 %
MCH: 28.5 pg (ref 26.6–33.0)
MCHC: 33.6 g/dL (ref 31.5–35.7)
MCV: 85 fL (ref 79–97)
Monocytes Absolute: 0.2 10*3/uL (ref 0.1–0.9)
Monocytes: 7 %
Neutrophils Absolute: 1.5 10*3/uL (ref 1.4–7.0)
Neutrophils: 44 %
Phosphorus: 2.9 mg/dL (ref 2.8–4.1)
Platelets: 232 10*3/uL (ref 150–450)
Potassium: 4.2 mmol/L (ref 3.5–5.2)
RBC: 5.19 x10E6/uL (ref 4.14–5.80)
RDW: 13.8 % (ref 11.6–15.4)
Sodium: 140 mmol/L (ref 134–144)
T3 Uptake Ratio: 28 % (ref 24–39)
T4, Total: 4.9 ug/dL (ref 4.5–12.0)
TSH: 0.91 u[IU]/mL (ref 0.450–4.500)
Total Protein: 6.8 g/dL (ref 6.0–8.5)
Triglycerides: 55 mg/dL (ref 0–149)
Uric Acid: 5.8 mg/dL (ref 3.8–8.4)
VLDL Cholesterol Cal: 11 mg/dL (ref 5–40)
WBC: 3.3 10*3/uL — ABNORMAL LOW (ref 3.4–10.8)
eGFR: 94 mL/min/{1.73_m2} (ref >59)

## 2023-06-02 ENCOUNTER — Other Ambulatory Visit: Payer: Self-pay | Admitting: Internal Medicine

## 2023-06-02 DIAGNOSIS — M5431 Sciatica, right side: Secondary | ICD-10-CM

## 2023-06-03 ENCOUNTER — Encounter: Payer: 59 | Admitting: Physician Assistant

## 2023-06-03 MED ORDER — TIZANIDINE HCL 4 MG PO TABS
4.0000 mg | ORAL_TABLET | Freq: Four times a day (QID) | ORAL | 0 refills | Status: DC | PRN
Start: 2023-06-03 — End: 2023-07-26

## 2023-06-03 NOTE — Progress Notes (Deleted)
[facility]  ____________________________________________   None    (approximate)  I have reviewed the triage vital signs and the nursing notes.   HISTORY  Chief Complaint No chief complaint on file.  Morning HPI Anthony Moore is a 28 y.o. 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 Authorizing Provider  tiZANidine (ZANAFLEX) 4 MG tablet Take 1 tablet (4 mg total) by mouth every 6 (six) hours as needed for muscle spasms. 03/15/23   Margarita Mail, DO    Allergies Penicillins  Family History  Problem Relation Age of Onset   Lupus Mother     Social History Social History   Tobacco Use   Smoking status: Some Days    Types: E-cigarettes   Smokeless tobacco: Former    Types: Snuff  Vaping Use   Vaping Use: Every day  Substance Use Topics   Alcohol use: Yes   Drug use: Never    Review of Systems Constitutional: No fever/chills Eyes: No visual changes. ENT: No sore throat. Cardiovascular: Denies chest pain. Respiratory: Denies shortness of breath. Gastrointestinal: No abdominal pain.  No nausea, no vomiting.  No diarrhea.  No constipation. Genitourinary: Negative for dysuria. Musculoskeletal: Negative for back pain. Skin: Negative for rash. Neurological: Negative for headaches, focal weakness or  numbness. {**Psychiatric:  Endocrine:  Hematological/Lymphatic:  Allergic/Immunilogical: **}  ____________________________________________   PHYSICAL EXAM:  VITAL SIGNS: @EDTRIAGEVITALS  Constitutional: Alert and oriented. Well appearing and in no acute distress. Eyes: Conjunctivae are normal. PERRL. EOMI. Head: Atraumatic. Nose: No congestion/rhinnorhea. Mouth/Throat: Mucous membranes are moist.  Oropharynx non-erythematous. Neck: No stridor.  {**No cervical spine tenderness to palpation.**} {**Hematological/Lymphatic/Immunilogical: No cervical lymphadenopathy. **}Cardiovascular: Normal rate, regular rhythm. Grossly normal heart sounds.  Good peripheral circulation. Respiratory: Normal respiratory effort.  No retractions. Lungs CTAB. Gastrointestinal: Soft and nontender. No distention. No abdominal bruits. No CVA tenderness. {**Genitourinary:  **}Musculoskeletal: No lower extremity tenderness nor edema.  No joint effusions. Neurologic:  Normal speech and language. No gross focal neurologic deficits are appreciated. No gait instability. Skin:  Skin is warm, dry and intact. No rash noted. Psychiatric: Mood and affect are normal. Speech and behavior are normal.  ____________________________________________   LABS (all labs ordered are listed, but only abnormal results are displayed)   ____________________________________________  EKG ____________________________________________    ____________________________________________   INITIAL IMPRESSION / ASSESSMENT AND PLAN / ED COURSE  As part of my medical decision making, I reviewed the following data within the electronic MEDICAL RECORD NUMBER {[id number]::"Notes from prior ED visits","[place] Controlled Substance Database"}       ____________________________________________   FINAL CLINICAL IMPRESSION    ED Discharge Orders     None        Note:  This document was prepared using Dragon voice recognition software and  may  include unintentional dictation errors.

## 2023-06-06 ENCOUNTER — Encounter: Payer: Self-pay | Admitting: Physician Assistant

## 2023-06-19 ENCOUNTER — Ambulatory Visit: Payer: Self-pay | Admitting: Emergency Medicine

## 2023-06-19 ENCOUNTER — Encounter: Payer: Self-pay | Admitting: Emergency Medicine

## 2023-06-19 VITALS — BP 134/87 | HR 71 | Temp 98.4°F | Resp 14 | Ht 74.0 in | Wt 184.0 lb

## 2023-06-19 DIAGNOSIS — Z0000 Encounter for general adult medical examination without abnormal findings: Secondary | ICD-10-CM

## 2023-06-19 NOTE — Progress Notes (Signed)
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 CL,RMA

## 2023-06-19 NOTE — Progress Notes (Signed)
    HISTORY  Chief Complaint Employment Physical (FF physical.)  HPI Anthony Moore is a 28 y.o. male  is here  for annual firefighters physical.  See separate papers.         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 Authorizing Provider  tiZANidine (ZANAFLEX) 4 MG tablet Take 1 tablet (4 mg total) by mouth every 6 (six) hours as needed for muscle spasms. 06/03/23  Yes Margarita Mail, DO    Allergies Penicillins  Family History  Problem Relation Age of Onset   Lupus Mother     Social History Social History   Tobacco Use   Smoking status: Some Days    Types: E-cigarettes   Smokeless tobacco: Former    Types: Snuff  Vaping Use   Vaping Use: Every day  Substance Use Topics   Alcohol use: Yes   Drug use: Never    Review of Systems Constitutional: No fever/chills Eyes: No visual changes. ENT: No sore throat. Cardiovascular: Denies chest pain. Respiratory: Denies shortness of breath. Gastrointestinal: No abdominal pain.   Genitourinary: Negative for dysuria. Musculoskeletal: Negative for constant musculoskeletal difficulties. Skin: Negative for rash. Neurological: Negative for focal weakness or numbness. ____________________________________________   PHYSICAL EXAM: Constitutional: Alert and oriented. Well appearing and in no acute distress. Eyes: Conjunctivae are  normal. PERRL. EOMI. Head: Atraumatic. Nose: No congestion/rhinnorhea. Mouth/Throat: Mucous membranes are moist.  Oropharynx non-erythematous. Neck: No stridor.  No tenderness on palpation of cervical spine posteriorly. Hematological/Lymphatic/Immunilogical: No cervical lymphadenopathy. Cardiovascular: Normal rate, regular rhythm. Grossly normal heart sounds.  Good peripheral circulation. Respiratory: Normal respiratory effort.  No retractions. Lungs CTAB. Gastrointestinal: Soft and nontender. No distention.  Bowel sounds are normoactive x 4 quadrants. Musculoskeletal: No lower extremity tenderness nor edema.  No joint effusions.  Nontender thoracic or lumbar spine palpation.  Normal gait was noted. Neurologic:  Normal speech and language. No gross focal neurologic deficits are appreciated. No gait instability. Skin:  Skin is warm, dry and intact. No rash noted. Psychiatric: Mood and affect are normal. Speech and behavior are normal.  ____________________________________________   LABS (all labs ordered are listed, but only abnormal results are displayed)  Labs were discussed with patient. ____________________________________________  EKG  Sinus Rhythm -Short PR syndrome  PRi = 110 -RSR(V1) -nondiagnostic. -Anterolateral ST-elevation -repolarization variant. Ventricular rate 63  No change from prior EKG 2023 & 2022   FINAL CLINICAL IMPRESSION(S)      ED Discharge Orders     None        Note:  This document was prepared using Dragon voice recognition software and may include unintentional dictation errors.

## 2023-07-16 ENCOUNTER — Ambulatory Visit
Admission: RE | Admit: 2023-07-16 | Discharge: 2023-07-16 | Disposition: A | Discharge status: Home / Self Care | Payer: 59 | Source: Ambulatory Visit | Attending: Emergency Medicine | Admitting: Emergency Medicine

## 2023-07-16 VITALS — BP 110/74 | HR 64 | Temp 97.9°F | Resp 18

## 2023-07-16 DIAGNOSIS — Z113 Encounter for screening for infections with a predominantly sexual mode of transmission: Secondary | ICD-10-CM | POA: Diagnosis not present

## 2023-07-16 DIAGNOSIS — Z7251 High risk heterosexual behavior: Secondary | ICD-10-CM | POA: Diagnosis not present

## 2023-07-16 NOTE — Discharge Instructions (Signed)
Give [place] a working phone number so that we can contact you if needed. Refrain from sexual contact until all of your labs have come back, symptoms have resolved, and your partner(s) are treated if necessary.  Follow-up with your primary care provider as needed.  Try to use condoms in the future.  Go to www.goodrx.com  or www.costplusdrugs.com to look up your medications. This will give you a list of where you can find your prescriptions at the most affordable prices. Or ask the pharmacist what the cash price is, or if they have any other discount programs available to help make your medication more affordable. This can be less expensive than what you would pay with insurance.

## 2023-07-16 NOTE — ED Triage Notes (Signed)
Patient presents to UC for his monthly STD screening. Asymptomatic.

## 2023-07-16 NOTE — ED Provider Notes (Signed)
HPI  SUBJECTIVE:  Anthony Moore is a 28 y.o. male who presents STD screening after having unprotected sex with a new male partner 5 days ago.  He is currently asymptomatic.  He has no urinary, GU complaints, abdominal, back, pelvic pain, fevers.  He currently has 5 other male sexual partners.  He states he usually uses condoms.  They are all asymptomatic.  He has never had an STD before-no history of gonorrhea, chlamydia, HIV, syphilis, trichomonas.  PCP: [facility]    Past Medical History:  Diagnosis Date   Insomnia    Plantar fasciitis 2014    Past Surgical History:  Procedure Laterality Date   FOOT SURGERY     PLANTAR FASCIA SURGERY Left 2015    Family History  Problem Relation Age of Onset   Lupus Mother     Social History   Tobacco Use   Smoking status: Some Days    Types: E-cigarettes   Smokeless tobacco: Former    Types: Snuff  Vaping Use   Vaping status: Every Day  Substance Use Topics   Alcohol use: Yes   Drug use: Never    No current facility-administered medications for this encounter.  Current Outpatient Medications:    tiZANidine (ZANAFLEX) 4 MG tablet, Take 1 tablet (4 mg total) by mouth every 6 (six) hours as needed for muscle spasms., Disp: 30 tablet, Rfl: 0  Allergies  Allergen Reactions   Penicillins     Possible reaction 6 years ago     ROS  As noted in HPI.   Physical Exam  BP 110/74 (BP Location: Left Arm)   Pulse 64   Temp 97.9 F (36.6 C) (Oral)   Resp 18   SpO2 98%   Constitutional: Well developed, well nourished, no acute distress Eyes:  EOMI, conjunctiva normal bilaterally HENT: Normocephalic, atraumatic,mucus membranes moist Respiratory: Normal inspiratory effort Cardiovascular: Normal rate GI: nondistended GU: Declined skin: No rash, skin intact Musculoskeletal: no deformities Neurologic: Alert & oriented x 3, no focal neuro deficits Psychiatric: Speech and behavior appropriate   ED  Course   Medications - No data to display  No orders of the defined types were placed in this encounter.   No results found for this or any previous visit (from the past 24 hour(s)). No results found.  ED Clinical Impression  1. Screening for STDs (sexually transmitted diseases)   2. Unprotected sex      ED Assessment/Plan     Checking gonorrhea, chlamydia, trichomonas.  Patient declined HIV, syphilis testing.  Advised patient to remain from intercourse until all of his labs are resulted and he and his partners have been treated if necessary.  Follow-up with PCP as needed.   Discussed labs, MDM, treatment plan, and plan for follow-up with patient. patient agrees with plan.   No orders of the defined types were placed in this encounter.     *This clinic note was created using Dragon dictation software. Therefore, there may be occasional mistakes despite careful proofreading.  ?    Domenick Gong, MD 07/16/23 1422

## 2023-07-17 LAB — CYTOLOGY, (ORAL, ANAL, URETHRAL) ANCILLARY ONLY
Chlamydia: POSITIVE — AB
Comment: NEGATIVE
Comment: NEGATIVE
Comment: NORMAL
Neisseria Gonorrhea: NEGATIVE
Trichomonas: NEGATIVE

## 2023-07-18 ENCOUNTER — Telehealth (HOSPITAL_COMMUNITY): Payer: Self-pay | Admitting: Emergency Medicine

## 2023-07-18 MED ORDER — DOXYCYCLINE HYCLATE 100 MG PO CAPS: 100.0000 mg | ORAL_CAPSULE | Freq: Two times a day (BID) | ORAL | 0 refills | Status: AC

## 2023-07-26 ENCOUNTER — Other Ambulatory Visit: Payer: Self-pay | Admitting: Internal Medicine

## 2023-07-26 DIAGNOSIS — M5431 Sciatica, right side: Secondary | ICD-10-CM

## 2023-07-29 MED ORDER — TIZANIDINE HCL 4 MG PO TABS
4.0000 mg | ORAL_TABLET | Freq: Four times a day (QID) | ORAL | 0 refills | Status: AC | PRN
Start: 2023-07-29 — End: ?

## 2023-09-19 ENCOUNTER — Other Ambulatory Visit: Payer: Self-pay | Admitting: Internal Medicine

## 2023-09-19 DIAGNOSIS — M5431 Sciatica, right side: Secondary | ICD-10-CM

## 2024-01-20 ENCOUNTER — Ambulatory Visit: Payer: 59 | Admitting: Internal Medicine

## 2024-03-17 ENCOUNTER — Ambulatory Visit: Payer: 59 | Admitting: Internal Medicine

## 2024-05-28 ENCOUNTER — Encounter: Admitting: Physician Assistant

## 2024-06-09 ENCOUNTER — Ambulatory Visit: Payer: Self-pay

## 2024-06-09 DIAGNOSIS — Z0289 Encounter for other administrative examinations: Secondary | ICD-10-CM

## 2024-06-09 LAB — POCT URINALYSIS DIPSTICK
Blood, UA: NEGATIVE
Glucose, UA: NEGATIVE
Ketones, UA: NEGATIVE
Leukocytes, UA: NEGATIVE
Nitrite, UA: NEGATIVE
Protein, UA: POSITIVE — AB
Spec Grav, UA: 1.015 (ref 1.010–1.025)
Urobilinogen, UA: 0.2 U/dL
pH, UA: 6 (ref 5.0–8.0)

## 2024-06-10 LAB — CMP12+LP+TP+TSH+6AC+CBC/D/PLT
ALT: 14 IU/L (ref 0–44)
AST: 27 IU/L (ref 0–40)
Albumin: 4.8 g/dL (ref 4.3–5.2)
Alkaline Phosphatase: 92 IU/L (ref 44–121)
BUN/Creatinine Ratio: 15 (ref 9–20)
BUN: 15 mg/dL (ref 6–20)
Basophils Absolute: 0 10*3/uL (ref 0.0–0.2)
Basos: 1 %
Bilirubin Total: 1.5 mg/dL — ABNORMAL HIGH (ref 0.0–1.2)
Calcium: 9.7 mg/dL (ref 8.7–10.2)
Chloride: 101 mmol/L (ref 96–106)
Chol/HDL Ratio: 2.6 ratio (ref 0.0–5.0)
Cholesterol, Total: 202 mg/dL — ABNORMAL HIGH (ref 100–199)
Creatinine, Ser: 0.98 mg/dL (ref 0.76–1.27)
EOS (ABSOLUTE): 0 10*3/uL (ref 0.0–0.4)
Eos: 1 %
Estimated CHD Risk: <0.5 times avg. (ref 0.0–1.0)
Free Thyroxine Index: 1.7 (ref 1.2–4.9)
GGT: 27 IU/L (ref 0–65)
Globulin, Total: 2.5 g/dL (ref 1.5–4.5)
Glucose: 81 mg/dL (ref 70–99)
HDL: 77 mg/dL (ref >39)
Hematocrit: 47.5 % (ref 37.5–51.0)
Hemoglobin: 15.4 g/dL (ref 13.0–17.7)
Immature Grans (Abs): 0 10*3/uL (ref 0.0–0.1)
Immature Granulocytes: 0 %
Iron: 156 ug/dL (ref 38–169)
LDH: 183 IU/L (ref 121–224)
LDL Chol Calc (NIH): 108 mg/dL — ABNORMAL HIGH (ref 0–99)
Lymphocytes Absolute: 2.1 10*3/uL (ref 0.7–3.1)
Lymphs: 53 %
MCH: 28.6 pg (ref 26.6–33.0)
MCHC: 32.4 g/dL (ref 31.5–35.7)
MCV: 88 fL (ref 79–97)
Monocytes Absolute: 0.2 10*3/uL (ref 0.1–0.9)
Monocytes: 6 %
Neutrophils Absolute: 1.6 10*3/uL (ref 1.4–7.0)
Neutrophils: 39 %
Phosphorus: 2.9 mg/dL (ref 2.8–4.1)
Platelets: 306 10*3/uL (ref 150–450)
Potassium: 4.2 mmol/L (ref 3.5–5.2)
RBC: 5.39 x10E6/uL (ref 4.14–5.80)
RDW: 14.5 % (ref 11.6–15.4)
Sodium: 142 mmol/L (ref 134–144)
T3 Uptake Ratio: 27 % (ref 24–39)
T4, Total: 6.2 ug/dL (ref 4.5–12.0)
TSH: 1.46 u[IU]/mL (ref 0.450–4.500)
Total Protein: 7.3 g/dL (ref 6.0–8.5)
Triglycerides: 96 mg/dL (ref 0–149)
Uric Acid: 6.3 mg/dL (ref 3.8–8.4)
VLDL Cholesterol Cal: 17 mg/dL (ref 5–40)
WBC: 4 10*3/uL (ref 3.4–10.8)
eGFR: 108 mL/min/{1.73_m2} (ref >59)

## 2024-06-16 ENCOUNTER — Encounter: Admitting: Physician Assistant

## 2024-06-16 ENCOUNTER — Encounter: Payer: Self-pay | Admitting: Physician Assistant

## 2024-06-16 ENCOUNTER — Ambulatory Visit: Payer: Self-pay | Admitting: Physician Assistant

## 2024-06-16 VITALS — BP 132/88 | HR 67 | Temp 98.4°F | Resp 14

## 2024-06-16 DIAGNOSIS — Z0000 Encounter for general adult medical examination without abnormal findings: Secondary | ICD-10-CM

## 2024-06-16 DIAGNOSIS — R5383 Other fatigue: Secondary | ICD-10-CM

## 2024-06-16 LAB — POCT URINALYSIS DIPSTICK
Bilirubin, UA: NEGATIVE
Blood, UA: NEGATIVE
Glucose, UA: NEGATIVE
Ketones, UA: NEGATIVE
Leukocytes, UA: NEGATIVE
Nitrite, UA: NEGATIVE
Protein, UA: NEGATIVE
Spec Grav, UA: 1.015 (ref 1.010–1.025)
Urobilinogen, UA: 0.2 U/dL
pH, UA: 7.5 (ref 5.0–8.0)

## 2024-06-16 NOTE — Progress Notes (Signed)
 [facility] ____________________________________________   None    (approximate)  I have reviewed the triage vital signs and the nursing notes.   HISTORY  Chief Complaint No chief complaint on file.   HPI Anthony Moore is a 29 y.o. male patient presents for annual physical exam.  Voices concern for fatigue.         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 Authorizing Provider  tiZANidine  (ZANAFLEX ) 4 MG tablet Take 1 tablet (4 mg total) by mouth every 6 (six) hours as needed for muscle spasms. 07/29/23  Yes Rockney Cid, DO    Allergies Penicillins  Family History  Problem Relation Age of Onset   Lupus Mother     Social History Social History   Tobacco Use   Smoking status: Some Days    Types: E-cigarettes   Smokeless tobacco: Former    Types: Snuff  Vaping Use   Vaping status: Every Day  Substance Use Topics   Alcohol use: Yes   Drug use: Never    Review of Systems Constitutional: No fever/chills Eyes: No visual changes. ENT: No sore throat. Cardiovascular: Denies chest pain. Respiratory: Denies shortness of breath. Gastrointestinal: No abdominal  pain.  No nausea, no vomiting.  No diarrhea.  No constipation. Genitourinary: Negative for dysuria. Musculoskeletal: Negative for back pain. Skin: Negative for rash. Neurological: Negative for headaches, focal weakness or numbness. Psychiatric: Insomnia Allergic/Immunilogical: Penicillin}  ____________________________________________   PHYSICAL EXAM:  VITAL SIGNS: BP 132/88  Pulse Rate 67  Temp 98.4 F (36.9 C)  Resp 14   Constitutional: Alert and oriented. Well appearing and in no acute distress. Eyes: Conjunctivae are normal. PERRL. EOMI. Head: Atraumatic. Nose: No congestion/rhinnorhea. Mouth/Throat: Mucous membranes are moist.  Oropharynx non-erythematous. Neck: No stridor.  No cervical spine tenderness to palpation. Hematological/Lymphatic/Immunilogical: No cervical lymphadenopathy. Cardiovascular: Normal rate, regular rhythm. Grossly normal heart sounds.  Good peripheral circulation. Respiratory: Normal respiratory effort.  No retractions. Lungs CTAB. Gastrointestinal: Soft and nontender. No distention. No abdominal bruits. No CVA tenderness. Genitourinary: Deferred Musculoskeletal: No lower extremity tenderness nor edema.  No joint effusions. Neurologic:  Normal speech and language. No gross focal neurologic deficits are appreciated. No gait instability. Skin:  Skin is warm, dry and intact. No rash noted. Psychiatric: Mood and affect are normal. Speech and behavior are normal.  ____________________________________________   LABS            Component Ref Range & Units (hover) 7 d ago (06/09/24) 1 yr ago (05/28/23) 1 yr ago (03/15/23) 1 yr ago (03/15/23) 1 yr ago (03/15/23) 2 yr ago (06/15/22) 2 yr ago (07/04/21)  Glucose 81 78  76  R, CM  87 76 R  Uric Acid 6.3 5.8 CM    5.3 CM 5.5 CM  Comment:            Therapeutic target for gout patients: <6.0  BUN 15 13  14  R  14 18  Creatinine, Ser 0.98 1.11  1.15 R  1.14 1.00  eGFR 108 94  90 R  91 107  BUN/Creatinine  Ratio 15 12  SEE NOTE: R, CM  12 18  Sodium 142 140  142 R  138 142  Potassium 4.2 4.2  4.6 R  4.3 4.1  Chloride 101 102  105 R  101 102  Calcium 9.7 9.4  10.0 R  9.5 9.5  Phosphorus 2.9 2.9    2.8 2.8  Total Protein 7.3 6.8  7.4 R  6.8 6.8  Albumin 4.8 4.7    4.7 R 4.9 R  Globulin, Total 2.5 2.1    2.1 1.9  Bilirubin Total 1.5 High  1.4 High   1.6 High  R  2.0 High  1.4 High   Alkaline Phosphatase 92 81    81 89  LDH 183 142    137 147  AST 27 19  18  R  20 20  ALT 14 9  7  Low  R  8 10  GGT 27 23    26 20   Iron 156 82    255 High  120  Cholesterol, Total 202 High  181    181 153  Triglycerides 96 55    46 56  HDL 77 97    90 76  VLDL Cholesterol Cal 17 11    9 12   LDL Chol Calc (NIH) 108 High  73    82 65  Chol/HDL Ratio 2.6 1.9 CM    2.0 CM 2.0 CM  Comment:                                   T. Chol/HDL Ratio                                             Men  Women                               1/2 Avg.Risk  3.4    3.3                                   Avg.Risk  5.0    4.4                                2X Avg.Risk  9.6    7.1                                3X Avg.Risk 23.4   11.0  Estimated CHD Risk  < 0.5  < 0.5 CM     < 0.5 CM  < 0.5 CM  Comment: The CHD Risk is based on the T. Chol/HDL ratio. Other factors affect CHD Risk such as hypertension, smoking, diabetes, severe  obesity, and family history of premature CHD.  TSH 1.460 0.910 0.91 R   0.321 Low  0.964  T4, Total 6.2 4.9 5.6 R   5.3 5.0  T3 Uptake Ratio 27 28    27 26   Free Thyroxine Index 1.7 1.4 2.0 R   1.4 1.3  WBC 4.0 3.3 Low    3.1 Low  R 2.6 Low  3.5  RBC 5.39 5.19   5.38 R 5.30 5.33  Hemoglobin 15.4 14.8   15.1 R 15.4 14.9  Hematocrit 47.5 44.0   45.8 R 45.1 45.1  MCV 88 85   85.1 R 85 85  MCH 28.6 28.5   28.1 R 29.1 28.0  MCHC 32.4 33.6   33.0 R 34.1 33.0  RDW 14.5 13.8   14.1 R 14.2 13.9  Platelets 306 232   253 R 243 231  Neutrophils 39 44   38.4 R 38 39  Lymphs 53 48    52 53  Monocytes 6 7    8 6    Eos 1 1    1 1   Basos 1 0    1 1  Neutrophils Absolute 1.6 1.5   1,190 Low  R 1.0 Low  1.4  Lymphocytes Absolute 2.1 1.6   1,655 R 1.4 1.9  Monocytes Absolute 0.2 0.2    0.2 0.2  EOS (ABSOLUTE) 0.0 0.0    0.0 0.0  Basophils Absolute 0.0 0.0   22 R 0.0 0.0  Immature Granulocytes 0 0    0 0  Immature Grans (Abs) 0.0 0.0    0.0 0.0                      Component Ref Range & Units (hover) 14:45 (06/16/24) 7 d ago (06/09/24) 1 yr ago (05/28/23) 2 yr ago (06/15/22) 2 yr ago (07/04/21) 3 yr ago (09/16/20)  Color, UA yellow yellow yellow yellow medium yellow yellow  Clarity, UA clear clear clear clear clear clear  Glucose, UA Negative Negative Negative Negative Negative Negative  Bilirubin, UA neg 1+ neg neg negative negative  Ketones, UA neg neg neg neg negative negative  Spec Grav, UA 1.015 1.015 1.025 1.020 >=1.030 Abnormal  1.015  Blood, UA neg neg neg neg negative negative  pH, UA 7.5 6.0 6.0 7.0 6.0 6.0  Protein, UA Negative Positive Abnormal  Negative Negative Negative Negative  Urobilinogen, UA 0.2 0.2 0.2 0.2 0.2 0.2  Nitrite, UA neg neg neg neg negative negative  Leukocytes, UA Negative Negative Negative Negative Negative Negative  Appearance  dark   medium light  Odor none none              ___________________________________________  EKG  Sinus bradycardia 59 bpm ____________________________________________    ____________________________________________   INITIAL IMPRESSION / ASSESSMENT AND PLAN As part of my medical decision making, I reviewed the following data within the electronic MEDICAL RECORD NUMBER      No acute findings on physical exam, EKG, labs.  Will draw vitamin D and testosterone  levels for complaint of fatigue.        ____________________________________________   FINAL CLINICAL IMPRESSION Well exam   ED Discharge Orders          Ordered    POCT Urinalysis Dipstick (CPT 81002)        06/16/24 1436             Note:  This  document was prepared using Dragon voice recognition software and may include  unintentional dictation errors.

## 2024-06-16 NOTE — Progress Notes (Signed)
 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  Questioning about T-level and no energy and uses work out supplements.

## 2024-06-18 ENCOUNTER — Other Ambulatory Visit

## 2024-06-19 ENCOUNTER — Other Ambulatory Visit: Payer: Self-pay

## 2024-06-19 DIAGNOSIS — R5383 Other fatigue: Secondary | ICD-10-CM

## 2024-06-19 DIAGNOSIS — Z0000 Encounter for general adult medical examination without abnormal findings: Secondary | ICD-10-CM

## 2024-06-22 LAB — TESTOSTERONE,FREE AND TOTAL
Testosterone, Free: 23.6 pg/mL (ref 9.3–26.5)
Testosterone: 1045 ng/dL — ABNORMAL HIGH (ref 264–916)

## 2024-08-30 ENCOUNTER — Telehealth: Admitting: Family

## 2024-08-30 DIAGNOSIS — R300 Dysuria: Secondary | ICD-10-CM

## 2024-08-30 NOTE — Progress Notes (Signed)
  Because of possible STD, I feel your condition warrants further evaluation and I recommend that you be seen in a face-to-face visit.   NOTE: There will be NO CHARGE for this E-Visit   If you are having a true medical emergency, please call 911.     For an urgent face to face visit, [place] has multiple urgent care centers for your convenience.  Click the link below for the full list of locations and hours, walk-in wait times, appointment scheduling options and driving directions:  Urgent Care - [place], [place], [place], [place], [place], [place]  [place]     Your MyChart E-visit questionnaire answers were reviewed by a board certified advanced clinical practitioner to complete your personal care plan based on your specific symptoms.    Thank you for using e-Visits.

## 2024-09-01 ENCOUNTER — Ambulatory Visit
Admission: RE | Admit: 2024-09-01 | Discharge: 2024-09-01 | Disposition: A | Discharge status: Home / Self Care | Attending: Emergency Medicine | Admitting: Emergency Medicine

## 2024-09-01 VITALS — BP 135/70 | HR 65 | Temp 98.3°F | Resp 18 | Ht 74.0 in | Wt 183.0 lb

## 2024-09-01 DIAGNOSIS — H1033 Unspecified acute conjunctivitis, bilateral: Secondary | ICD-10-CM | POA: Diagnosis not present

## 2024-09-01 DIAGNOSIS — Z113 Encounter for screening for infections with a predominantly sexual mode of transmission: Secondary | ICD-10-CM | POA: Insufficient documentation

## 2024-09-01 DIAGNOSIS — R369 Urethral discharge, unspecified: Secondary | ICD-10-CM | POA: Insufficient documentation

## 2024-09-01 DIAGNOSIS — R300 Dysuria: Secondary | ICD-10-CM | POA: Diagnosis not present

## 2024-09-01 LAB — POCT URINALYSIS DIP (MANUAL ENTRY)
Bilirubin, UA: NEGATIVE
Blood, UA: NEGATIVE
Glucose, UA: 100 mg/dL — AB
Ketones, POC UA: NEGATIVE mg/dL
Leukocytes, UA: NEGATIVE
Nitrite, UA: POSITIVE — AB
Spec Grav, UA: 1.02 (ref 1.010–1.025)
Urobilinogen, UA: 1 U/dL
pH, UA: 8.5 — AB (ref 5.0–8.0)

## 2024-09-01 MED ORDER — SULFAMETHOXAZOLE-TRIMETHOPRIM 800-160 MG PO TABS: 1.0000 | ORAL_TABLET | Freq: Two times a day (BID) | ORAL | 0 refills | Status: AC

## 2024-09-01 MED ORDER — POLYMYXIN B-TRIMETHOPRIM 10000-0.1 UNIT/ML-% OP SOLN: 1.0000 [drp] | Freq: Four times a day (QID) | OPHTHALMIC | 0 refills | Status: AC

## 2024-09-01 NOTE — ED Provider Notes (Signed)
 Anthony Moore    CSN: [id number] Arrival date & time: 09/01/24  9065      History   Chief Complaint Chief Complaint  Patient presents with   Dysuria    HPI Anthony Moore is a 29 y.o. male.  Patient presents with 4-day history of burning with urination.  He has noted occasional scant clear penile discharge.  He denies testicular pain, abdominal pain, hematuria, flank pain.  He has been taking Azo.  He recently had unprotected sexual intercourse but has low concern for STDs because he has been with this partner for a couple of months and she tested negative for STDs 4 days ago.  Patient had a telehealth visit on 08/30/2024 for dysuria and was instructed to be seen in person.  His history includes chlamydia in 2024.  Patient also presents with bilateral eye redness x 3 days.  No trauma.  No eye drainage, eye pain, change in vision.    The history is provided by the patient and medical records.    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 Authorizing Provider  sulfamethoxazole -trimethoprim  (BACTRIM  DS) 800-160 MG tablet Take 1 tablet by mouth 2 (two) times daily for 7 days. 09/01/24 09/08/24 Yes  Corlis Burnard DEL, NP  trimethoprim -polymyxin b  (POLYTRIM ) ophthalmic solution Place 1 drop into both eyes 4 (four) times daily for 7 days. 09/01/24 09/08/24 Yes Corlis Burnard DEL, NP  tiZANidine  (ZANAFLEX ) 4 MG tablet Take 1 tablet (4 mg total) by mouth every 6 (six) hours as needed for muscle spasms. 07/29/23   Bernardo Fend, DO    Family History Family History  Problem Relation Age of Onset   Lupus Mother     Social History Social History   Tobacco Use   Smoking status: Some Days    Types: E-cigarettes   Smokeless tobacco: Former    Types: Snuff  Vaping Use   Vaping status: Every Day  Substance Use Topics   Alcohol use: Yes   Drug use: Never     Allergies   Penicillins   Review of Systems Review of Systems  Constitutional:  Negative for chills and fever.  Eyes:  Positive for redness. Negative for pain, discharge, itching and visual disturbance.  Gastrointestinal:  Negative for abdominal pain, constipation, diarrhea, nausea and vomiting.  Genitourinary:  Positive for dysuria and penile discharge. Negative for flank pain, hematuria and testicular pain.  Skin:  Negative for color change and rash.     Physical Exam Triage Vital Signs ED Triage Vitals  Encounter Vitals Group     BP 09/01/24 0951 135/70     Girls Systolic BP  Percentile --      Girls Diastolic BP Percentile --      Boys Systolic BP Percentile --      Boys Diastolic BP Percentile --      Pulse Rate 09/01/24 0951 65     Resp 09/01/24 0951 18     Temp 09/01/24 0951 98.3 F (36.8 C)     Temp Source 09/01/24 0951 Oral     SpO2 09/01/24 0951 99 %     Weight 09/01/24 0953 183 lb (83 kg)     Height 09/01/24 0953 6' 2 (1.88 m)     Head Circumference --      Peak Flow --      Pain Score 09/01/24 0953 0     Pain Loc --      Pain Education --      Exclude from Growth Chart --    No data found.  Updated Vital Signs BP 135/70 (BP Location: Left Arm)   Pulse 65   Temp 98.3 F (36.8 C) (Oral)   Resp 18    Ht 6' 2 (1.88 m)   Wt 183 lb (83 kg)   SpO2 99%   BMI 23.50 kg/m   Visual Acuity Right Eye Distance:   Left Eye Distance:   Bilateral Distance:    Right Eye Near:   Left Eye Near:    Bilateral Near:     Physical Exam Constitutional:      General: He is not in acute distress. HENT:     Mouth/Throat:     Mouth: Mucous membranes are moist.  Eyes:     General: Lids are normal. Vision grossly intact.        Right eye: No discharge.        Left eye: No discharge.     Extraocular Movements: Extraocular movements intact.     Conjunctiva/sclera:     Right eye: Right conjunctiva is injected.     Left eye: Left conjunctiva is injected.     Pupils: Pupils are equal, round, and reactive to light.     Comments: Bilateral conjunctiva mildly injected.  Cardiovascular:     Rate and Rhythm: Normal rate and regular rhythm.  Pulmonary:     Effort: Pulmonary effort is normal. No respiratory distress.  Abdominal:     General: Bowel sounds are normal.     Palpations: Abdomen is soft.     Tenderness: There is no abdominal tenderness. There is no right CVA tenderness, left CVA tenderness, guarding or rebound.  Neurological:     Mental Status: He is alert.      UC Treatments / Results  Labs (all labs ordered are listed, but only abnormal results are displayed) Labs Reviewed  POCT URINALYSIS DIP (MANUAL ENTRY) - Abnormal; Notable for the following components:      Result Value   Color, UA orange (*)    Glucose, UA =100 (*)    pH, UA 8.5 (*)    Protein Ur, POC trace (*)    Nitrite, UA Positive (*)    All other components within normal limits  URINE CULTURE  CYTOLOGY, (ORAL, ANAL, URETHRAL) ANCILLARY ONLY    EKG   Radiology No results found.  Procedures Procedures (including critical care time)  Medications Ordered in UC Medications - No data to display  Initial Impression / Assessment and Plan / UC Course  I have reviewed the triage vital signs and the nursing  notes.  Pertinent labs & imaging results that  were available during my care of the patient were reviewed by me and considered in my medical decision making (see chart for details).    Dysuria, penile discharge, STD screening, bilateral conjunctivitis.  Urine positive for nitrite.  Treating today with Bactrim  x 7 days.  Urine culture pending.  Discussed with patient that we will call him if the culture shows need to change or discontinue the antibiotic.  Patient obtained urethral self swab for STD testing.  Discussed that his test results will be available on his MyChart account and that we will call him if treatment is needed.  Instructed him to abstain from all sexual activity until all test results are back and treatment is completed if needed.  Patient also has unspecified conjunctivitis.  Treating today with Polytrim  eyedrops.  Instructed patient to follow-up with his PCP.  He agrees to plan of care. Final Clinical Impressions(s) / UC Diagnoses   Final diagnoses:  Dysuria  Screening for STD (sexually transmitted disease)  Penile discharge  Acute conjunctivitis of both eyes, unspecified acute conjunctivitis type     Discharge Instructions      Take the antibiotic as directed.  The urine culture is pending.  We will call you if it shows the need to change or discontinue your antibiotic.    Your other tests are pending.  If your test results are positive, we will call you.  You and your sexual partner(s) may require treatment at that time.  Do not have sexual activity for at least 7 days.    Follow up with your primary care provider.        ED Prescriptions     Medication Sig Dispense Auth. Provider   sulfamethoxazole -trimethoprim  (BACTRIM  DS) 800-160 MG tablet Take 1 tablet by mouth 2 (two) times daily for 7 days. 14 tablet Corlis Burnard DEL, NP   trimethoprim -polymyxin b  (POLYTRIM ) ophthalmic solution Place 1 drop into both eyes 4 (four) times daily for 7 days. 10 mL Corlis Burnard DEL, NP       PDMP not reviewed this encounter.   Corlis Burnard DEL, NP 09/01/24 1036

## 2024-09-01 NOTE — Discharge Instructions (Addendum)
 Take the antibiotic as directed.  The urine culture is pending.  We will call you if it shows the need to change or discontinue your antibiotic.    Your other tests are pending.  If your test results are positive, we will call you.  You and your sexual partner(s) may require treatment at that time.  Do not have sexual activity for at least 7 days.    Follow up with your primary care provider.

## 2024-09-01 NOTE — ED Triage Notes (Signed)
 Patient states that he unprotected sex on Thursday and noticed some dysuria on Saturday.  Denies any hematuria, some low back pain.  Patient would like STI testing, partner was tested w/negative results.  Taken OTC AZO.

## 2024-09-02 LAB — CYTOLOGY, (ORAL, ANAL, URETHRAL) ANCILLARY ONLY
Chlamydia: NEGATIVE
Comment: NEGATIVE
Comment: NEGATIVE
Comment: NORMAL
Neisseria Gonorrhea: NEGATIVE
Trichomonas: NEGATIVE

## 2024-09-02 LAB — URINE CULTURE: Culture: NO GROWTH
